# Patient Record
Sex: Female | Born: 1980 | Race: White | Hispanic: No | Marital: Single | Smoking: Never smoker
Health system: Southern US, Community
[De-identification: ages and names within clinical notes are randomized; demographics above are authoritative.]

---

## 2000-09-16 ENCOUNTER — Ambulatory Visit (HOSPITAL_COMMUNITY): Admission: RE | Admit: 2000-09-16 | Discharge: 2000-09-17 | Payer: Self-pay | Admitting: Oral and Maxillofacial Surgery

## 2000-10-01 HISTORY — PX: MOUTH SURGERY: SHX715

## 2010-03-10 ENCOUNTER — Emergency Department: Payer: Self-pay | Admitting: Emergency Medicine

## 2011-08-08 ENCOUNTER — Ambulatory Visit: Payer: Self-pay | Admitting: Internal Medicine

## 2011-08-17 ENCOUNTER — Ambulatory Visit: Payer: Self-pay | Admitting: Internal Medicine

## 2013-01-27 IMAGING — US ABDOMEN ULTRASOUND
1 series · 17 of 25 positions shown · non-contrast
Comparison: none

REASON FOR EXAM: fam hx ovarian ca
COMMENTS:

[Series 1: abdomen ultrasound · 17 of 81 slices shown]
[im 1/81]
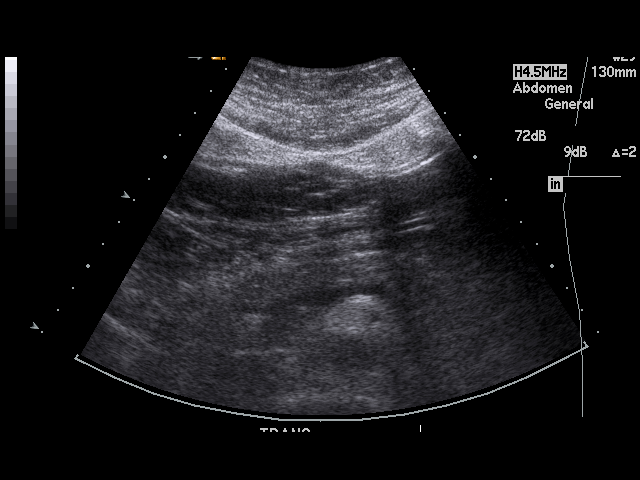
[im 7/81]
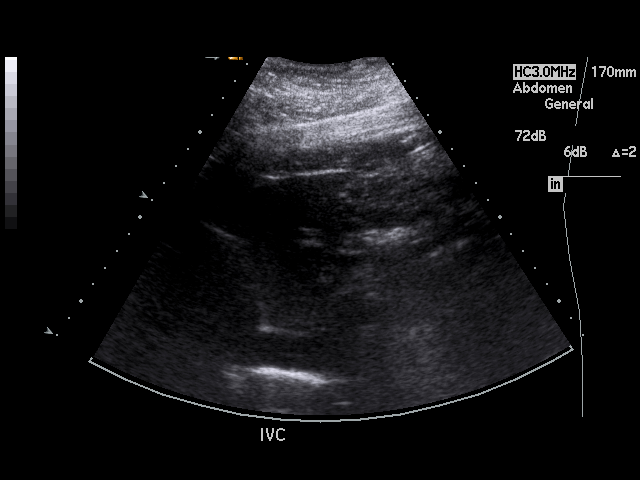
[im 11/81]
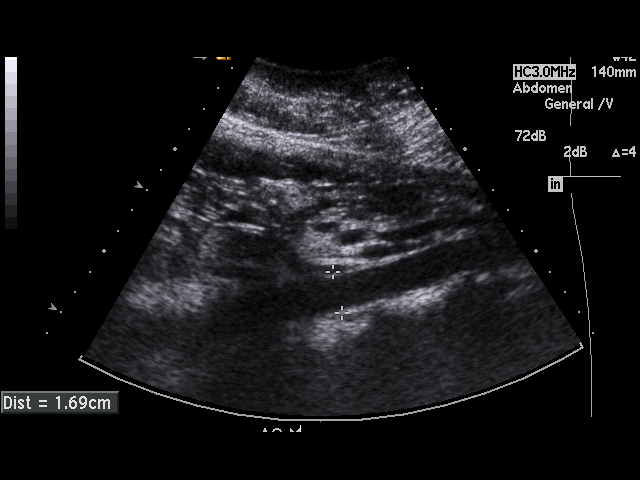
[im 17/81]
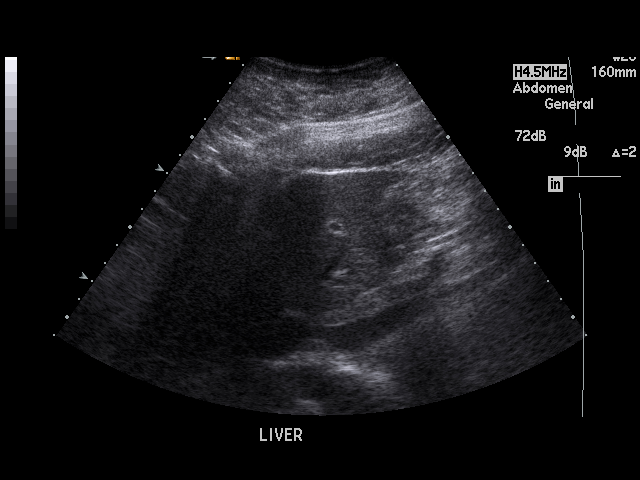
[im 21/81]
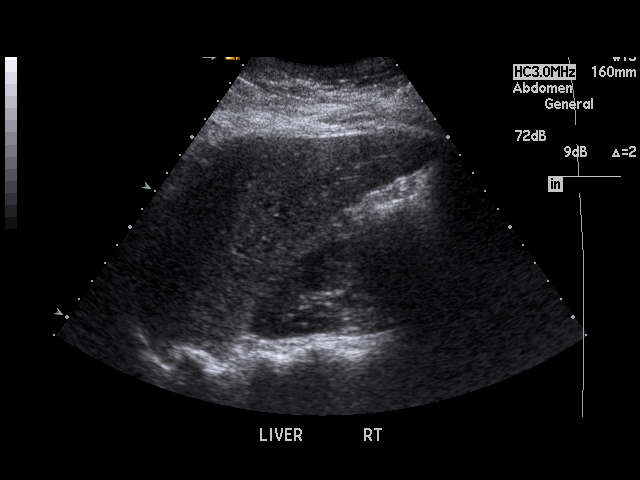
[im 27/81]
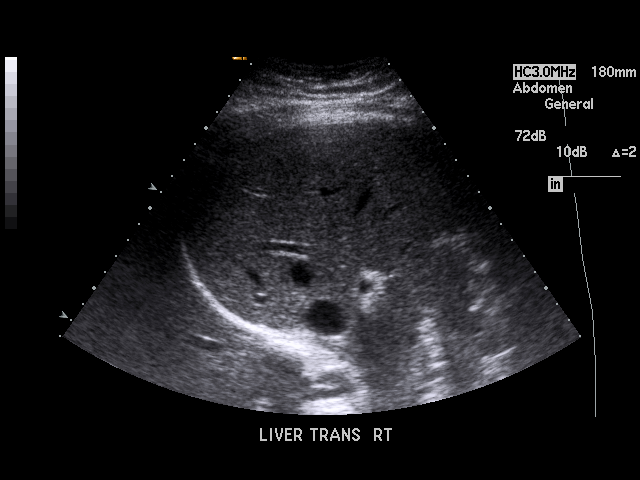
[im 31/81]
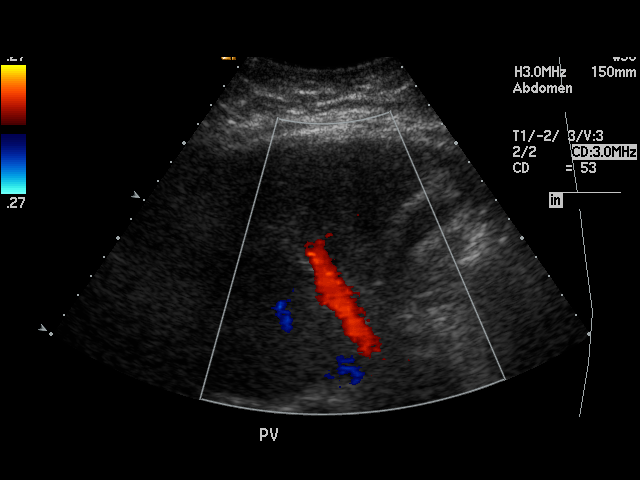
[im 37/81]
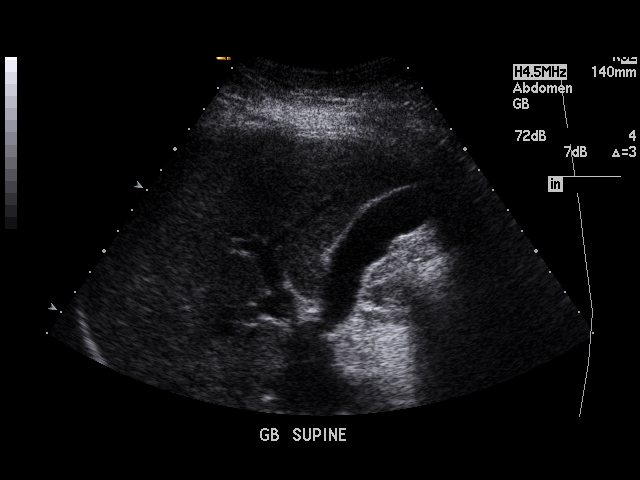
[im 41/81]
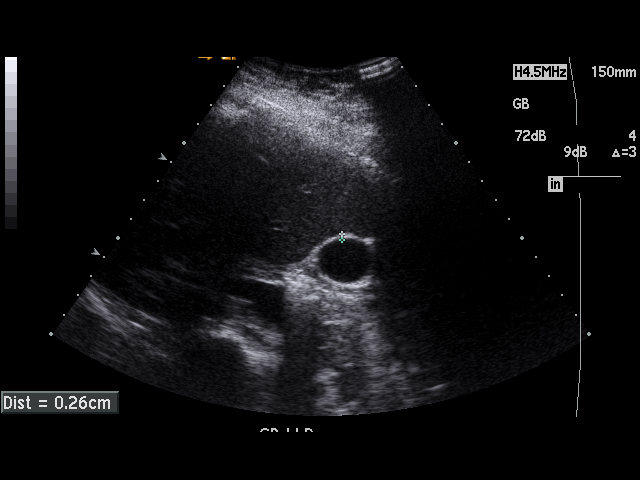
[im 44/81]
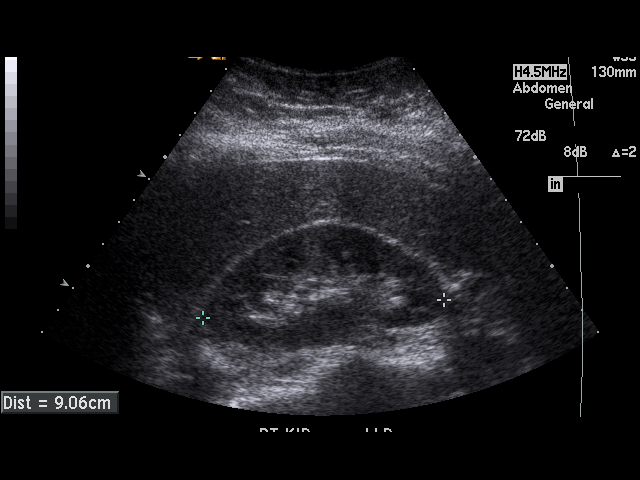
[im 51/81]
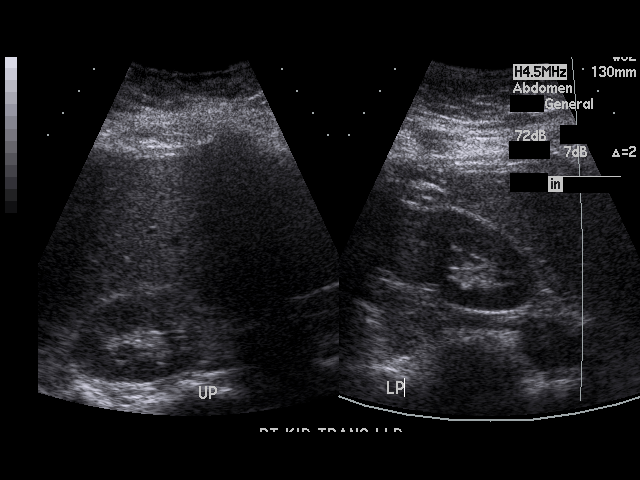
[im 54/81]
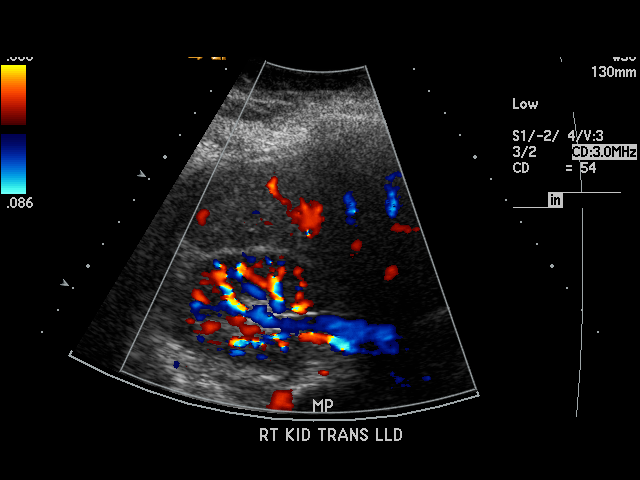
[im 61/81]
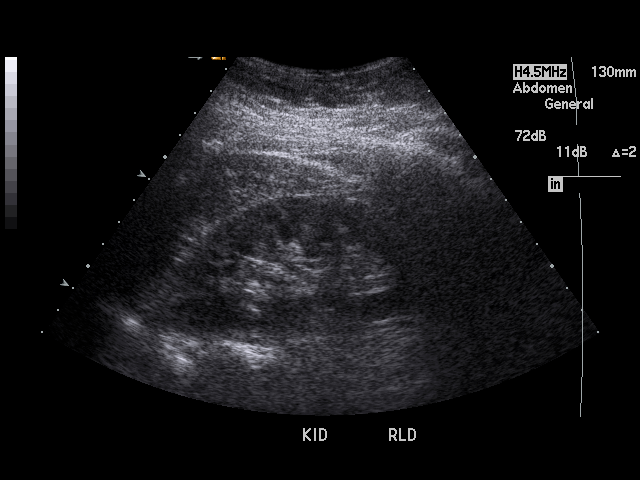
[im 64/81]
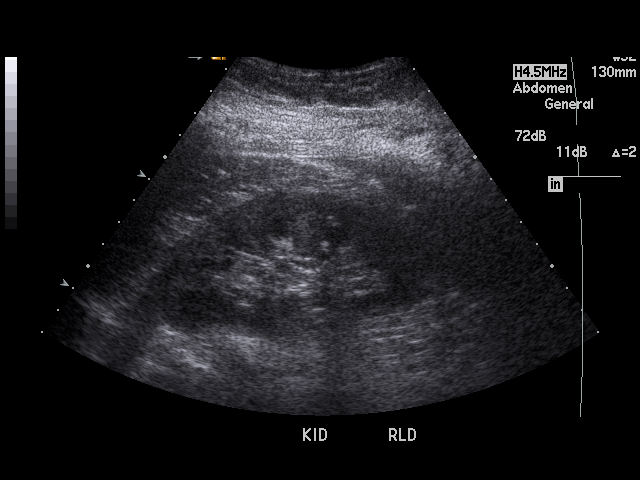
[im 71/81]
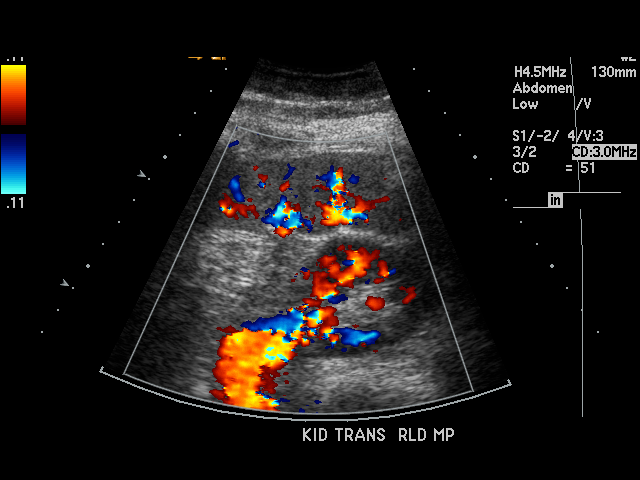
[im 74/81]
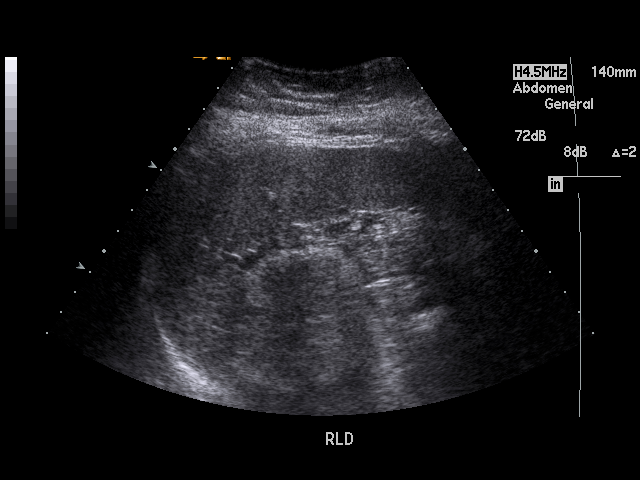
[im 81/81]
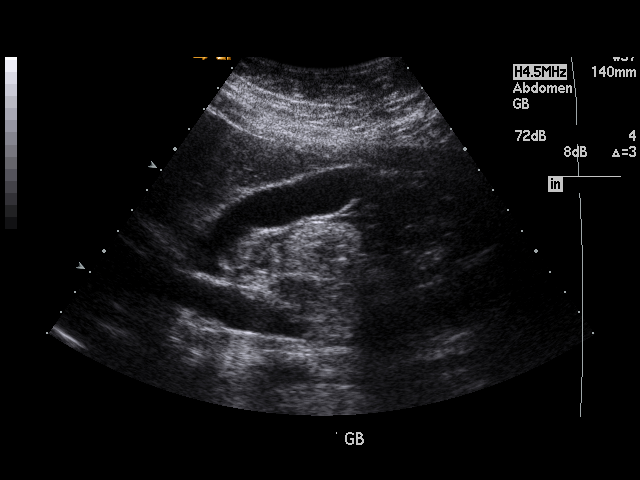

[17 of 25 positions shown; findings below may reference images not displayed]

PROCEDURE:     TEANNA - TEANNA ABDOMEN GENERAL SURVEY  - August 08, 2011  [DATE]

RESULT:     The liver exhibits normal echotexture with no focal mass nor
ductal dilation. Portal venous flow is normal in direction toward the liver.
The gallbladder is adequately distended with no evidence of stones, wall
thickening, or pericholecystic fluid. There is no positive sonographic
Murphy's sign. The common bile duct is normal at 2.7 mm in diameter. The
pancreatic tail was obscured by bowel gas but the pancreatic body are normal
in appearance. The spleen, abdominal aorta, inferior vena cava, and kidneys
exhibit no acute abnormality.
IMPRESSION: I see no acute abnormality of the visualized abdominal
viscera. The pancreatic tail could not be demonstrated due to bowel gas. No
ascites is demonstrated.

## 2013-01-27 IMAGING — US TRANSABDOMINAL ULTRASOUND OF PELVIS
1 series · 17 of 25 positions shown · non-contrast
Comparison: none

REASON FOR EXAM: fam hx ovarian ca irregular menses
COMMENTS:

[Series 1: transabdominal ultrasound of pelvis · 17 of 114 slices shown]
[im 1/114]
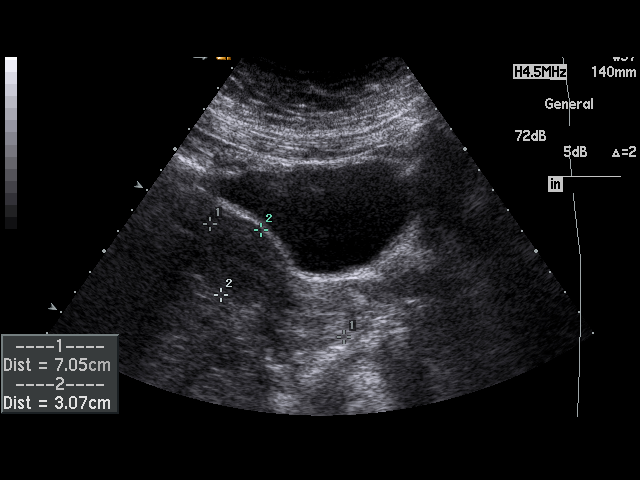
[im 10/114]
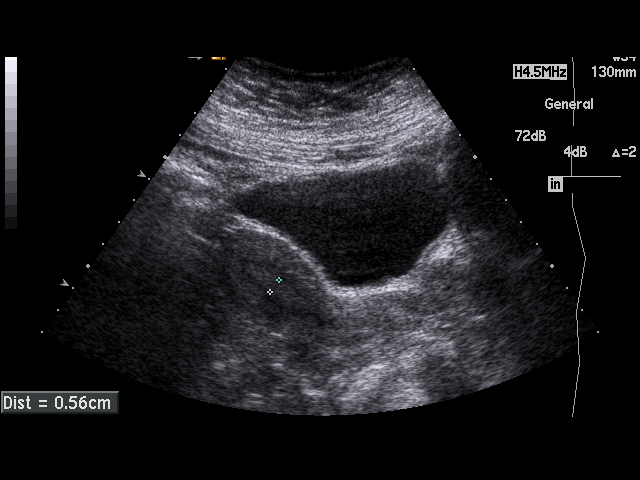
[im 15/114]
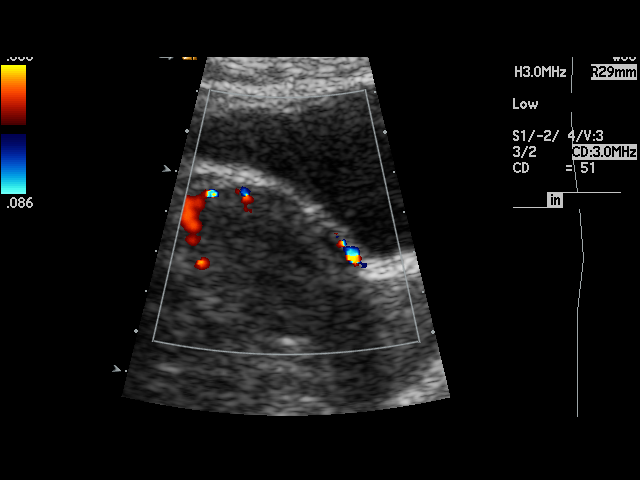
[im 24/114]
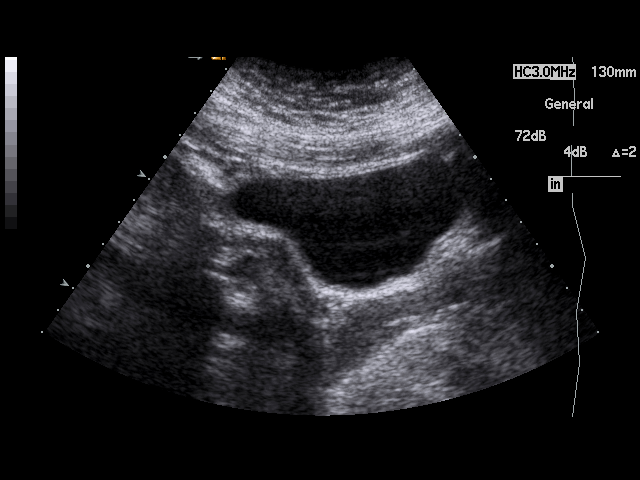
[im 29/114]
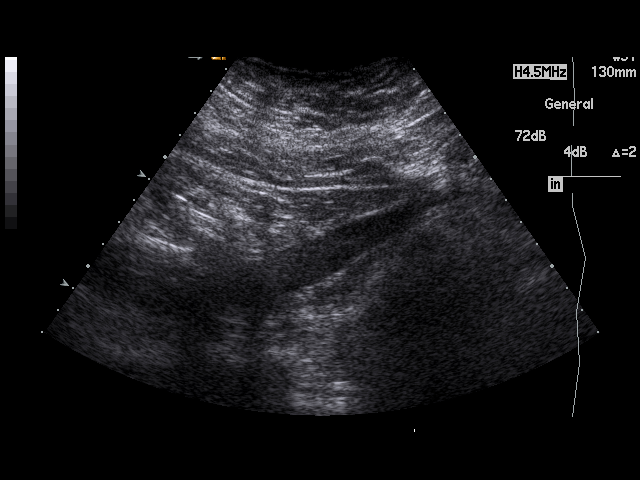
[im 38/114]
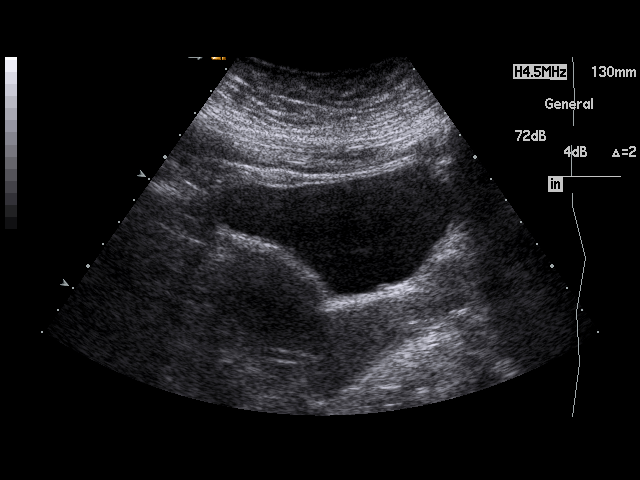
[im 43/114]
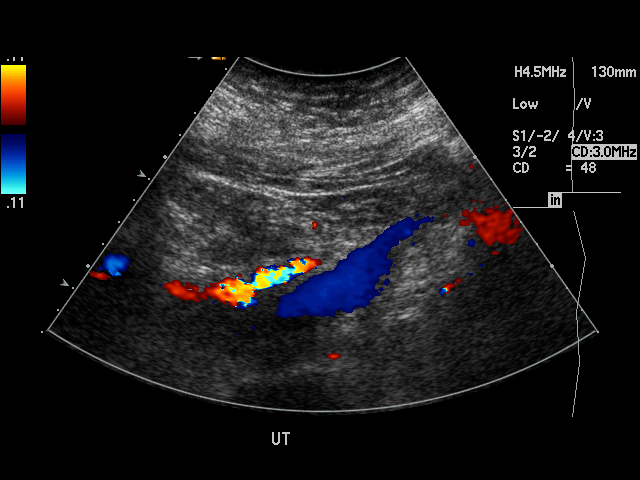
[im 52/114]
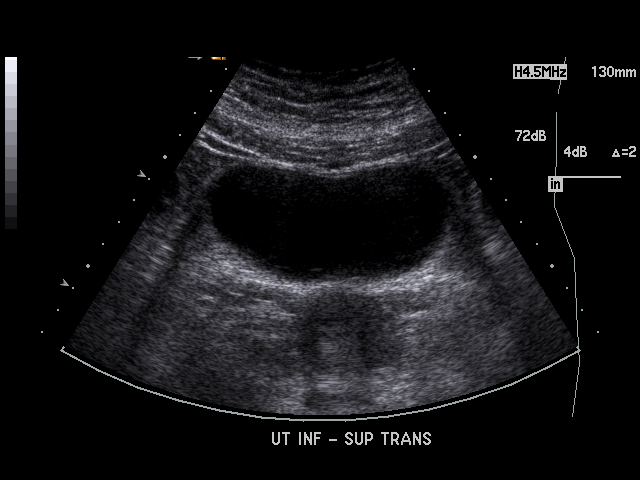
[im 57/114]
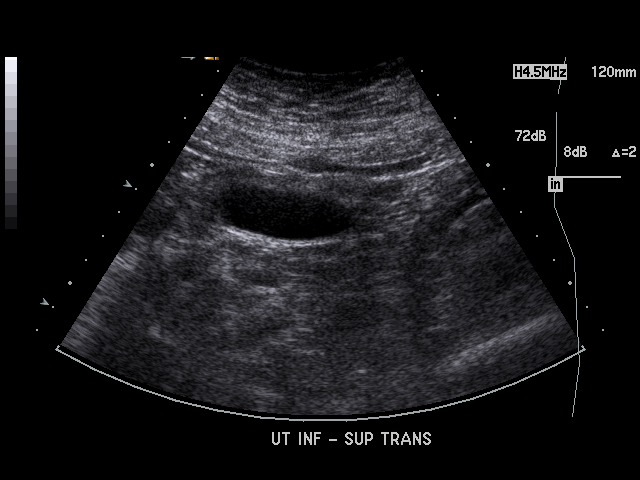
[im 62/114]
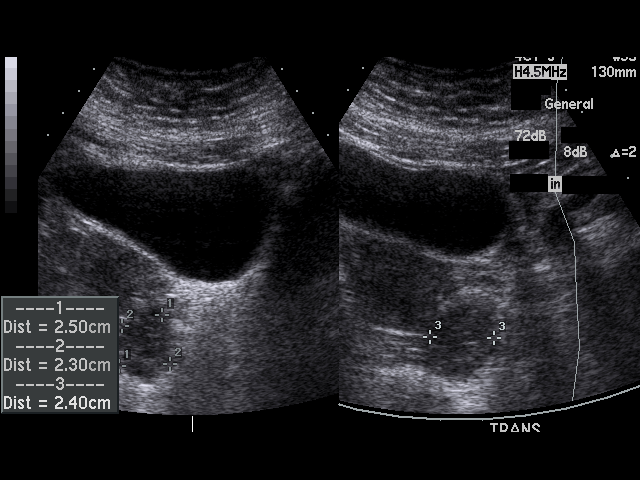
[im 71/114]
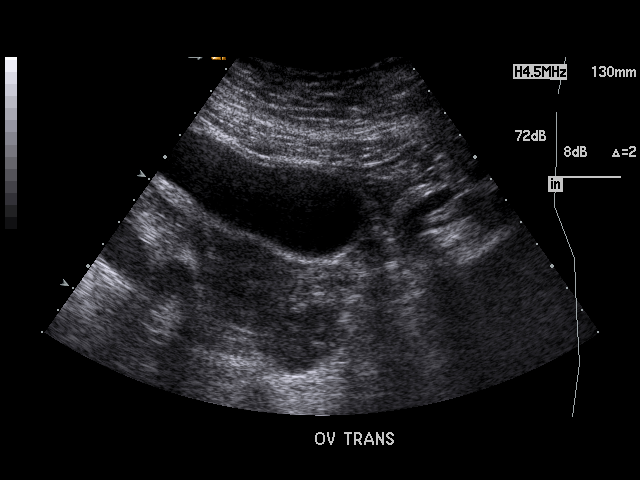
[im 76/114]
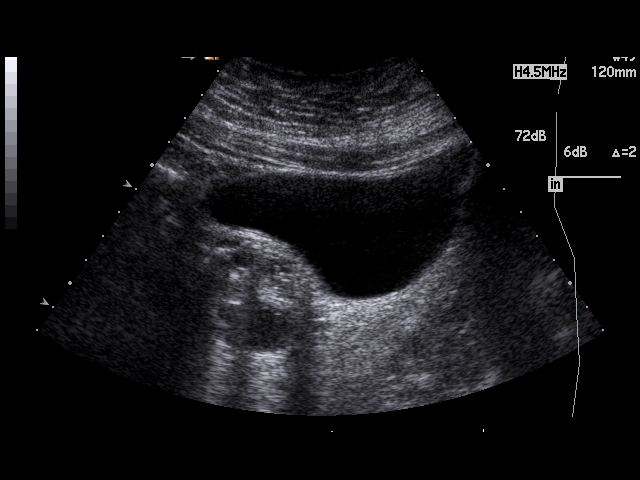
[im 85/114]
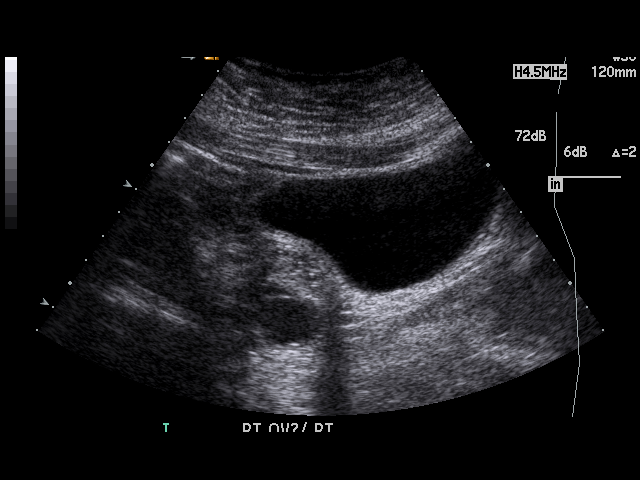
[im 90/114]
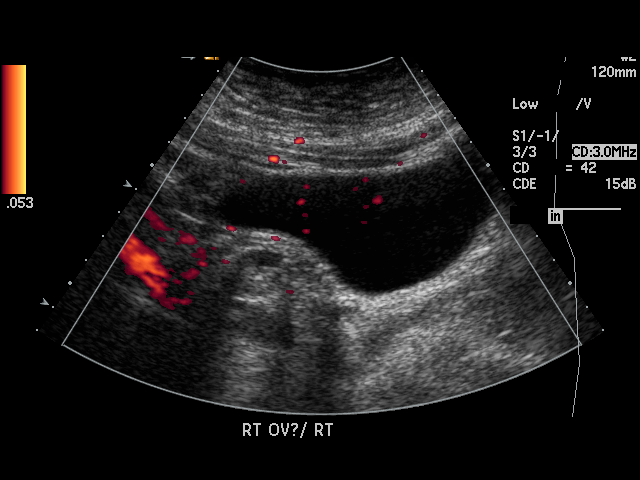
[im 99/114]
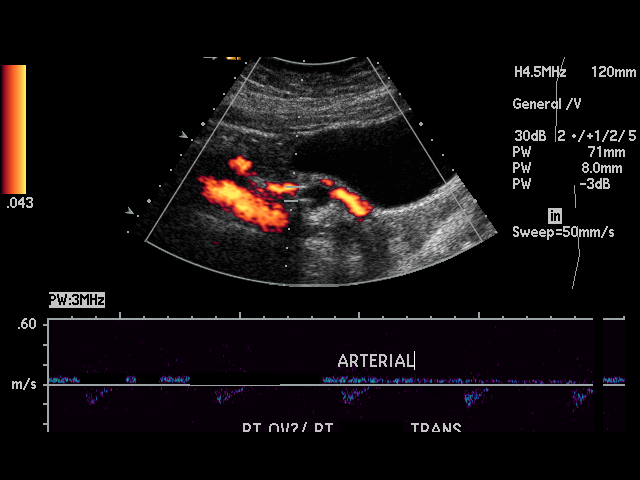
[im 104/114]
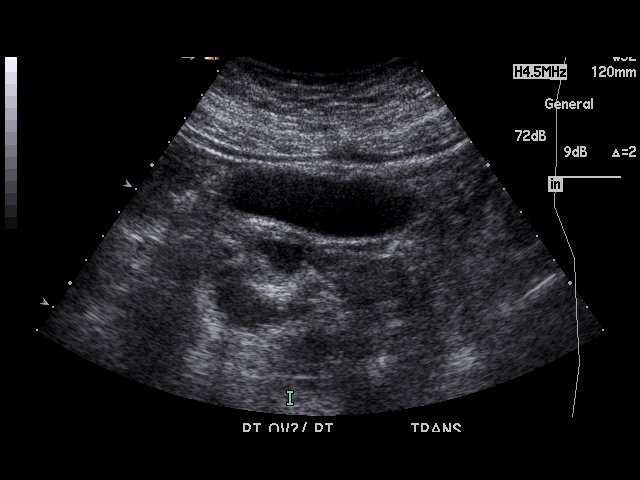
[im 114/114]
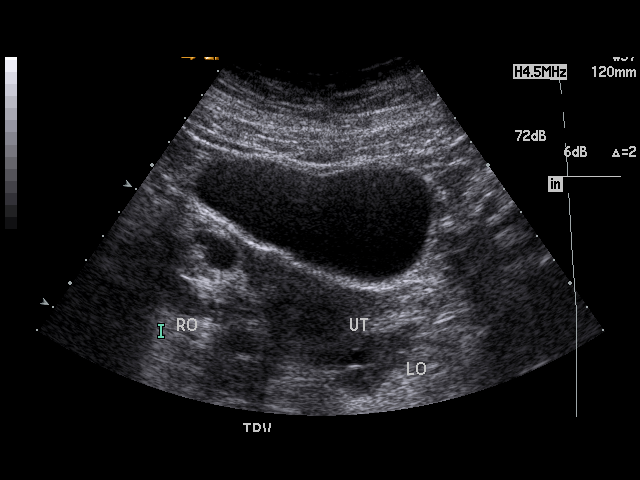

[17 of 25 positions shown; findings below may reference images not displayed]

PROCEDURE:     AXHAR - AXHAR PELVIS MASS EXAM  - [DATE]  [DATE] [DATE] [DATE]

RESULT:     The uterus is normal in contour and measures 7.1 x 3.1 x 3.8 cm.
The endometrial stripe measures just under 6 mm in diameter. The right ovary
measures 3.7 x 3.5 x 3.4 centimeters. However, its echotexture is
heterogeneous as compared to the normal appearing left ovary. The left ovary
measures 2.5 x 2.4 x 2.3 cm. The vascularity is of both ovaries is within
the limits of normal. No free fluid is demonstrated within the pelvis

The study is limited due to the patient's inability to undergo transvaginal
imaging.
IMPRESSION: 1. In the right adnexal region the appearance of the ovary is abnormal with
there being complex echoes present. This could reflect the presence of
multiple cysts containing debris, but solid entities are not excluded. The
left ovary appears normal.
2. The uterus exhibits no acute abnormality.

Gynecological evaluation pelvic CT scanning may be useful.

## 2013-07-17 IMAGING — CT CT PELVIS W/ CM
1 series · 15 of 32 positions shown, 19 images · non-contrast
Comparison: none

REASON FOR EXAM: us showed enlarged ovary
COMMENTS:

[Series 2: soft tissue · axial · 0.76mm/px · z∈[-55,+179]mm · 15 of 87 slices shown, 19 images]
[im 6/87  soft-tissue]
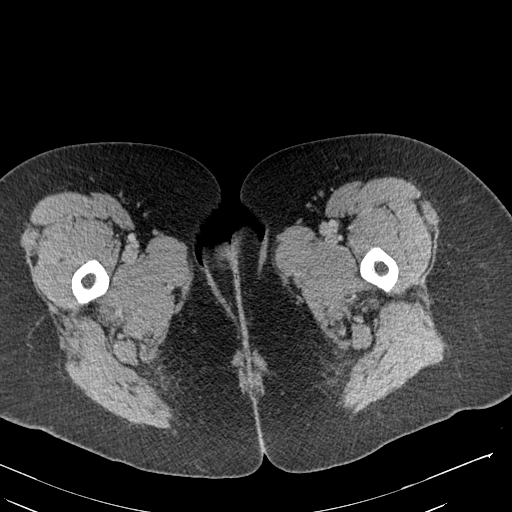
[im 6/87  bone]
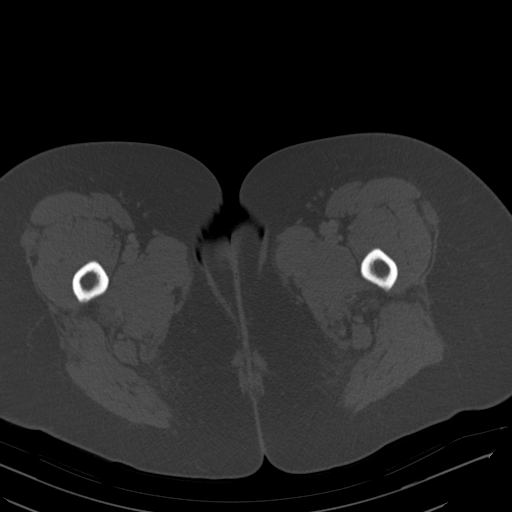
[im 12/87  soft-tissue]
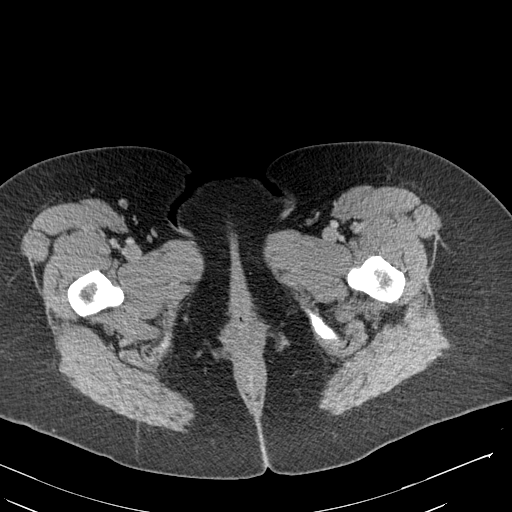
[im 17/87  soft-tissue]
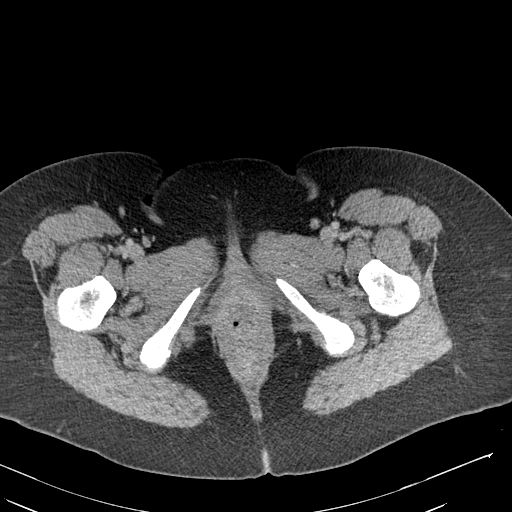
[im 25/87  soft-tissue]
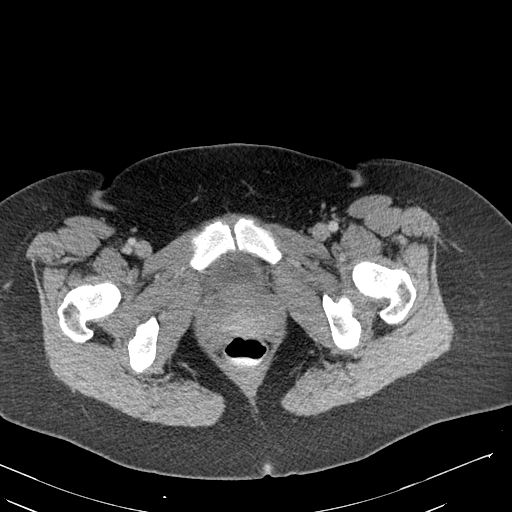
[im 31/87  soft-tissue]
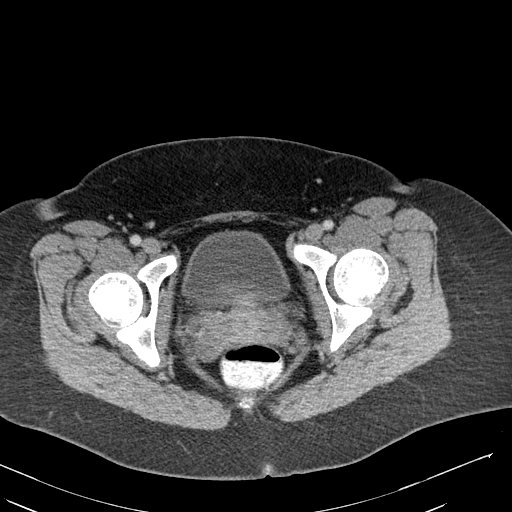
[im 37/87  soft-tissue]
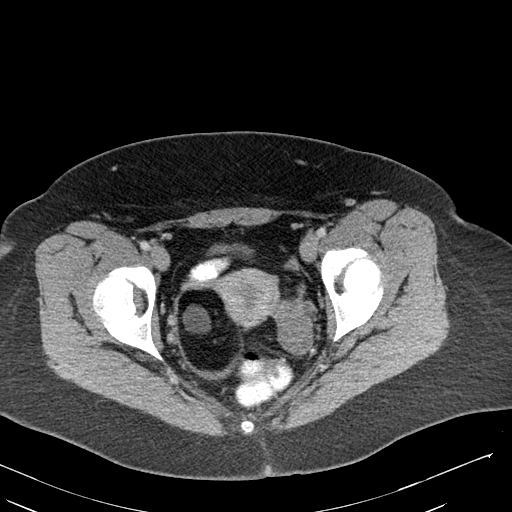
[im 45/87  soft-tissue]
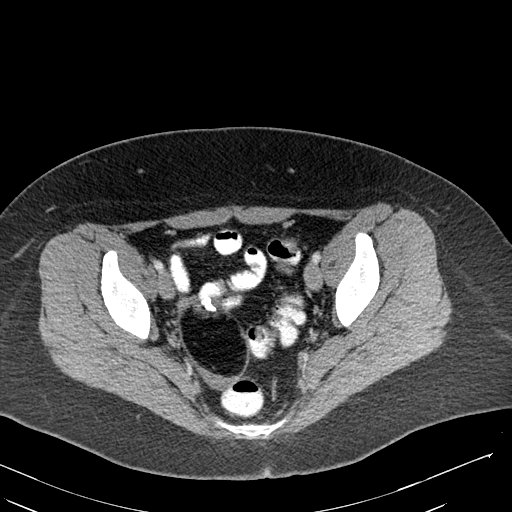
[im 50/87  soft-tissue]
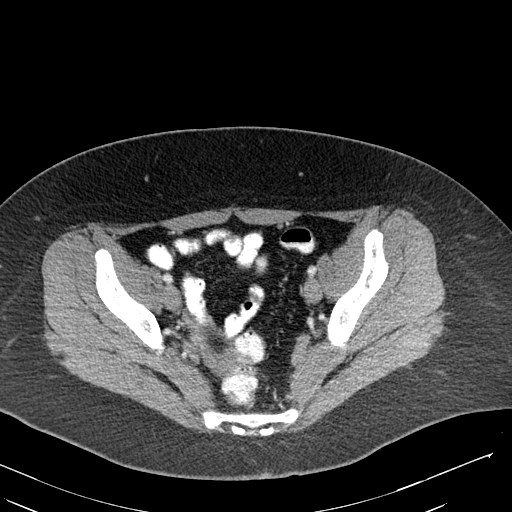
[im 56/87  soft-tissue]
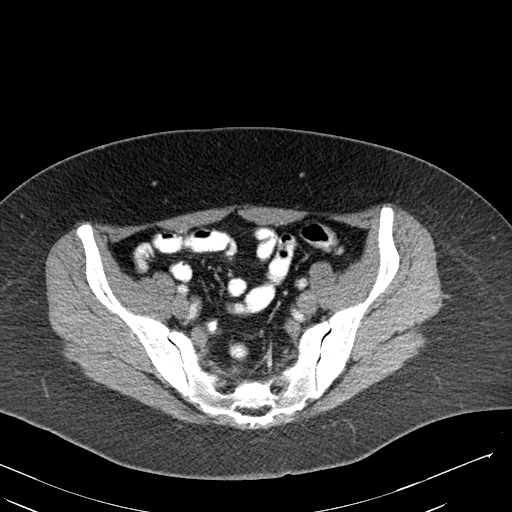
[im 56/87  bone]
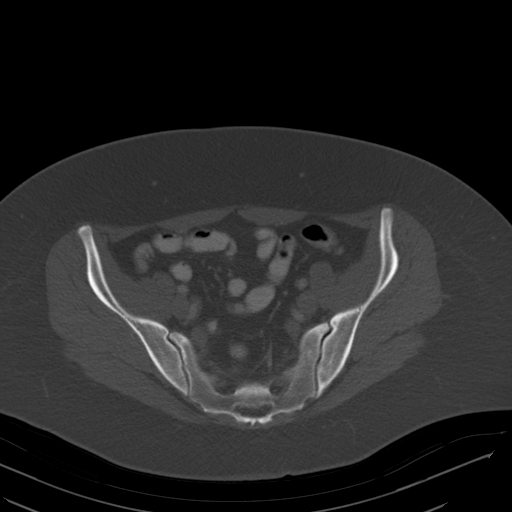
[im 62/87  soft-tissue]
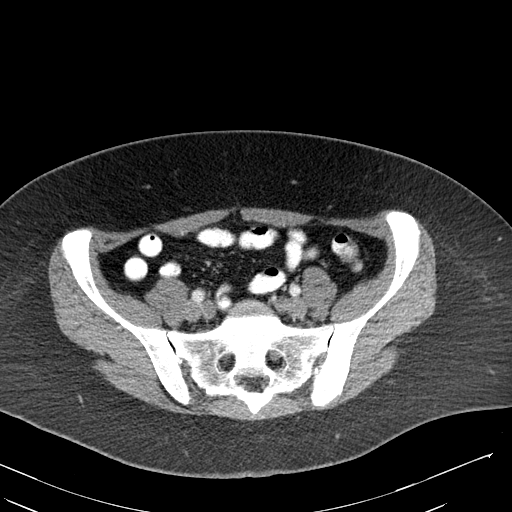
[im 70/87  soft-tissue]
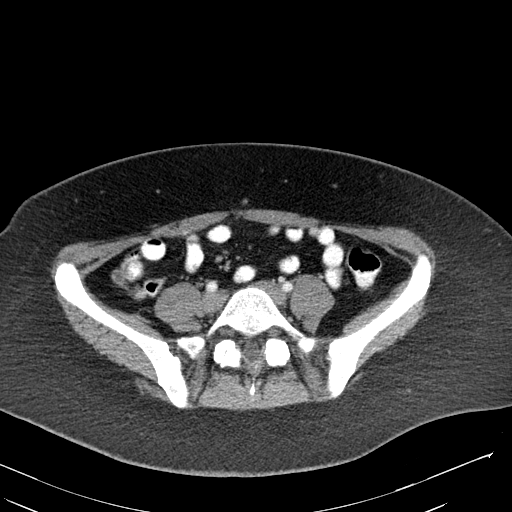
[im 75/87  soft-tissue]
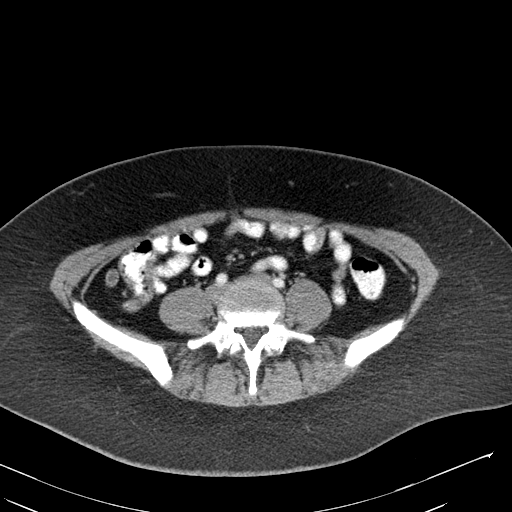
[im 75/87  lung]
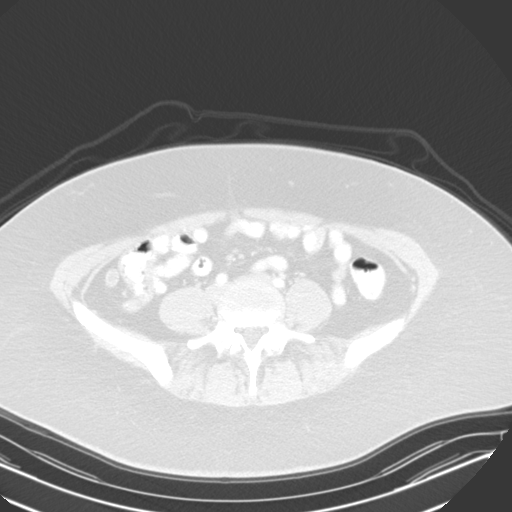
[im 78/87  lung]
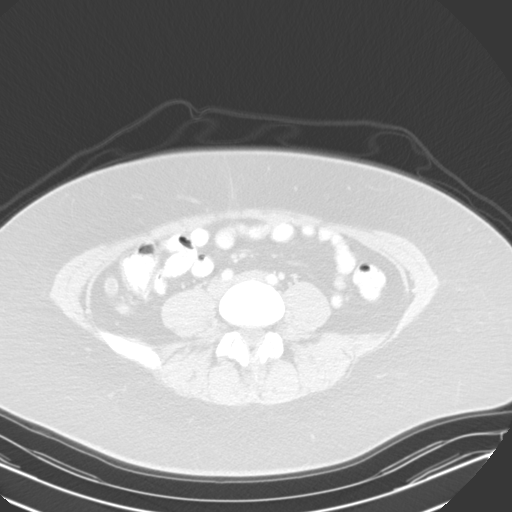
[im 81/87  soft-tissue]
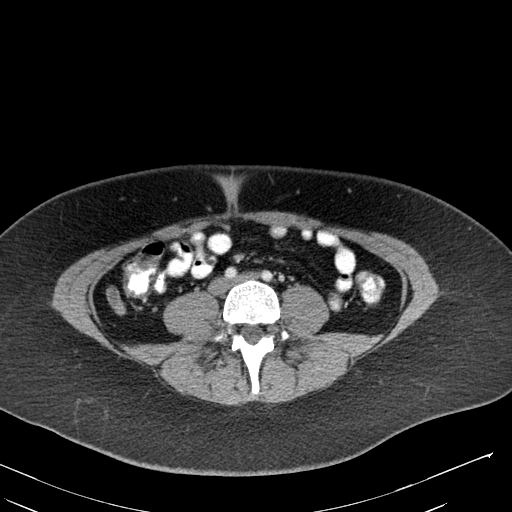
[im 81/87  lung]
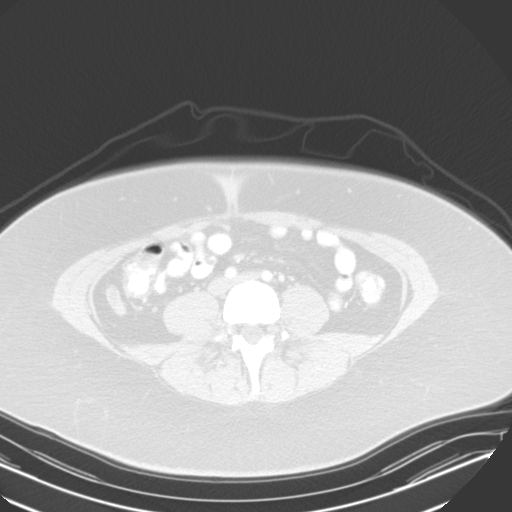
[im 84/87  lung]
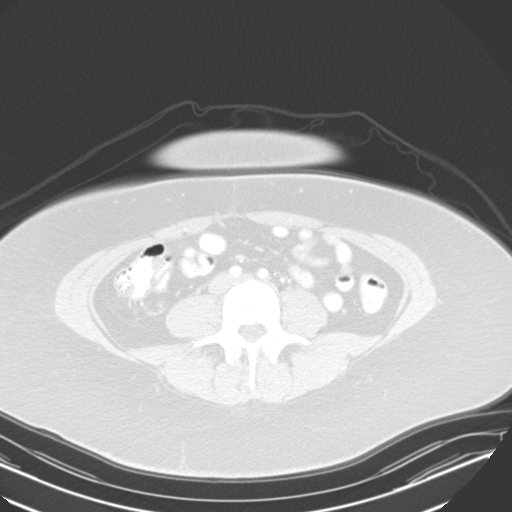

[15 of 32 positions shown; findings below may reference images not displayed]

PROCEDURE:     TIGER - TIGER PELVIS STANDARD W  - August 17, 2011  [DATE]

RESULT:     Axial CT scanning was performed through the pelvis with
reconstructions at 3 mm intervals and slice thicknesses following
intravenous administration of 85 cc of [id number]. The patient also received
oral contrast material.

In the left adnexal region there is a structure which measures approximately
3.9 cm AP x 2.9 cm transversely. I see no calcification within it. It is of
intermediate density. On the right there is a fat and soft tissue density
structure measuring 7.8 x 5.1 cm. This appears to be separate from bowel.
There may be calcification associated with it seen on image 43. The
rectosigmoid colon appears normal. I see no significant free fluid. The
uterus is normal in appearance. The partially distended urinary bladder also
appears normal. I see no inguinal lymphadenopathy. The bony pelvis exhibits
no acute abnormality.
IMPRESSION: 1. There is a complex appearing right adnexal mass. Its maximal dimensions
are as given above and the structure appears to have increased in size since
dimensions given on the previous pelvic ultrasound dated 08 August, 2011.
The left ovary may have increased slightly in size since the previous study.
2. I see no abnormality of the uterus nor of the rectosigmoid nor of the
urinary bladder.
3. There is no evidence of ascites nor of lymphadenopathy.

The findings on the right may reflect a process such as a dermoid or
possibly teratoma. On the left the appearance of the ovary is nonspecific.
Correlation with clinical and laboratory values is needed. Other entities
including infectious etiologies, neoplasia, as well as endometriosis could
produce some of these findings.

## 2022-11-19 ENCOUNTER — Encounter: Payer: Self-pay | Admitting: Intensive Care

## 2022-11-19 ENCOUNTER — Emergency Department
Admission: EM | Admit: 2022-11-19 | Discharge: 2022-11-19 | Disposition: A | Discharge status: Home / Self Care | Payer: BC Managed Care – PPO | Attending: Student in an Organized Health Care Education/Training Program | Admitting: Student in an Organized Health Care Education/Training Program

## 2022-11-19 ENCOUNTER — Ambulatory Visit
Admission: EM | Admit: 2022-11-19 | Discharge: 2022-11-19 | Payer: BC Managed Care – PPO | Attending: Family Medicine | Admitting: Family Medicine

## 2022-11-19 ENCOUNTER — Emergency Department: Payer: BC Managed Care – PPO

## 2022-11-19 ENCOUNTER — Other Ambulatory Visit: Payer: Self-pay

## 2022-11-19 DIAGNOSIS — S0101XA Laceration without foreign body of scalp, initial encounter: Secondary | ICD-10-CM

## 2022-11-19 DIAGNOSIS — Y92002 Bathroom of unspecified non-institutional (private) residence single-family (private) house as the place of occurrence of the external cause: Secondary | ICD-10-CM | POA: Insufficient documentation

## 2022-11-19 DIAGNOSIS — S0990XA Unspecified injury of head, initial encounter: Secondary | ICD-10-CM

## 2022-11-19 DIAGNOSIS — R55 Syncope and collapse: Secondary | ICD-10-CM | POA: Diagnosis not present

## 2022-11-19 DIAGNOSIS — W19XXXA Unspecified fall, initial encounter: Secondary | ICD-10-CM | POA: Diagnosis not present

## 2022-11-19 DIAGNOSIS — W01190A Fall on same level from slipping, tripping and stumbling with subsequent striking against furniture, initial encounter: Secondary | ICD-10-CM | POA: Insufficient documentation

## 2022-11-19 DIAGNOSIS — Z23 Encounter for immunization: Secondary | ICD-10-CM | POA: Insufficient documentation

## 2022-11-19 LAB — URINALYSIS, ROUTINE W REFLEX MICROSCOPIC
Bilirubin Urine: NEGATIVE
Glucose, UA: NEGATIVE mg/dL
Hgb urine dipstick: NEGATIVE
Ketones, ur: NEGATIVE mg/dL
Leukocytes,Ua: NEGATIVE
Nitrite: NEGATIVE
Protein, ur: NEGATIVE mg/dL
Specific Gravity, Urine: 1.026 (ref 1.005–1.030)
pH: 5 (ref 5.0–8.0)

## 2022-11-19 LAB — CBC
HCT: 44.9 % (ref 36.0–46.0)
Hemoglobin: 14.5 g/dL (ref 12.0–15.0)
MCH: 28.7 pg (ref 26.0–34.0)
MCHC: 32.3 g/dL (ref 30.0–36.0)
MCV: 88.9 fL (ref 80.0–100.0)
Platelets: 272 10*3/uL (ref 150–400)
RBC: 5.05 MIL/uL (ref 3.87–5.11)
RDW: 13.9 % (ref 11.5–15.5)
WBC: 10.7 10*3/uL — ABNORMAL HIGH (ref 4.0–10.5)
nRBC: 0 % (ref 0.0–0.2)

## 2022-11-19 LAB — BASIC METABOLIC PANEL
Anion gap: 8 (ref 5–15)
BUN: 15 mg/dL (ref 6–20)
CO2: 24 mmol/L (ref 22–32)
Calcium: 9 mg/dL (ref 8.9–10.3)
Chloride: 104 mmol/L (ref 98–111)
Creatinine, Ser: 1.01 mg/dL — ABNORMAL HIGH (ref 0.44–1.00)
GFR, Estimated: >60 mL/min (ref >60)
Glucose, Bld: 121 mg/dL — ABNORMAL HIGH (ref 70–99)
Potassium: 4.1 mmol/L (ref 3.5–5.1)
Sodium: 136 mmol/L (ref 135–145)

## 2022-11-19 LAB — PREGNANCY, URINE: Preg Test, Ur: NEGATIVE

## 2022-11-19 MED ORDER — SODIUM CHLORIDE 0.9 % IV BOLUS
1000.0000 mL | Freq: Once | INTRAVENOUS | Status: AC
  Administered 2022-11-19: 1000 mL via INTRAVENOUS

## 2022-11-19 MED ORDER — TETANUS-DIPHTH-ACELL PERTUSSIS 5-2.5-18.5 LF-MCG/0.5 IM SUSY
0.5000 mL | PREFILLED_SYRINGE | Freq: Once | INTRAMUSCULAR | Status: AC
  Administered 2022-11-19: 0.5 mL via INTRAMUSCULAR
  Filled 2022-11-19: qty 0.5

## 2022-11-19 NOTE — Discharge Instructions (Signed)
Given you passed out in the bathroom and suffered a head injury, you have been advised to follow up immediately in the emergency department for concerning signs or symptoms as discussed during your visit. Do not delay.   Based on concerns about condition, if you do not follow up in the ED, you may risk poor outcomes including worsening of condition, delayed treatment and potentially life threatening issues. If you have declined to go to the ED at this time, you should call your PCP immediately to set up a follow up appointment.

## 2022-11-19 NOTE — ED Triage Notes (Signed)
Patient reports syncopal episode today around 8am. Head laceration present to left side of head. Bleeding controlled.  Reports she had episode of emesis this AM around 1am. Has history of LOC after getting sick in past.

## 2022-11-19 NOTE — ED Provider Notes (Signed)
[facility] Provider Note    Event Date/Time   First MD Initiated Contact with Patient 11/19/22 1204     (approximate)   History   Loss of Consciousness and Emesis   HPI  Morgan Brown is a 42 y.o. female who presents today for evaluation after syncopal event.  Patient was sent from the urgent care for head imaging.  Patient reports that she had a syncopal event while she was getting ready for work this morning.  She reports that she fell and hit her head on a piece of bathroom furniture.  She reports that she felt very lightheaded just prior to syncopal event.  She reports that this has happened to her many times in the past and today felt no different.  She reports that she had food poisoning last night and had vomiting and diarrhea which has since resolved, however she reports that she feels dehydrated.  She came to the emergency department for her head laceration.  She has had no vomiting since the event.  No other injury sustained.  No visual changes, headache now, no neck pain.  She does not take anticoagulation.  Denies tongue biting or incontinence.  There are no problems to display for this patient.         Physical Exam   Triage Vital Signs: ED Triage Vitals [11/19/22 1151]  Enc Vitals Group     BP      Pulse      Resp      Temp      Temp src      SpO2      Weight 220 lb (99.8 kg)     Height 5' 4"$  (1.626 m)     Head Circumference      Peak Flow      Pain Score 3     Pain Loc      Pain Edu?      Excl. in [place]?     Most recent vital signs: Vitals:   11/19/22 1211 11/19/22 1405  BP: (!) 145/96 (!) 143/105  Pulse: (!) 110 81  Resp: 17 16  Temp: 98.1 F (36.7 C) 99 F (37.2 C)  SpO2: 100% 100%    Physical Exam Vitals and nursing note reviewed.  Constitutional:      General: Awake and alert. No acute distress.    Appearance: Normal appearance. The patient is normal weight.  HENT:     Head: Normocephalic.  Left  parietal area with 2 cm linear laceration that appears to be superficial with no retained foreign body.  No underlying hematoma noted.    Mouth: Mucous membranes are moist.  Eyes:     General: PERRL. Normal EOMs        Right eye: No discharge.        Left eye: No discharge.     Conjunctiva/sclera: Conjunctivae normal.  Cardiovascular:     Rate and Rhythm: Normal rate and regular rhythm.     Pulses: Normal pulses.     Heart sounds: Normal heart sounds Pulmonary:     Effort: Pulmonary effort is normal. No respiratory distress.     Breath sounds: Normal breath sounds.  Abdominal:     Abdomen is soft. There is no abdominal tenderness. No rebound or guarding. No distention. Musculoskeletal:        General: No swelling. Normal range of motion.     Cervical back: Normal range of motion and neck supple.  No midline cervical  spine tenderness.  Full range of motion of neck.  Negative Spurling test.  Negative Lhermitte sign.  Normal strength and sensation in bilateral upper extremities. Normal grip strength bilaterally.  Normal intrinsic muscle function of the hand bilaterally.  Normal radial pulses bilaterally. Skin:    General: Skin is warm and dry.     Capillary Refill: Capillary refill takes less than 2 seconds.     Findings: No rash.  Neurological:     Mental Status: The patient is awake and alert.  Neurological: GCS 15 alert and oriented x3 Normal speech, no expressive or receptive aphasia or dysarthria Cranial nerves II through XII intact Normal visual fields 5 out of 5 strength in all 4 extremities with intact sensation throughout No extremity drift Normal finger-to-nose testing, no limb or truncal ataxia      ED Results / Procedures / Treatments   Labs (all labs ordered are listed, but only abnormal results are displayed) Labs Reviewed  BASIC METABOLIC PANEL - Abnormal; Notable for the following components:      Result Value   Glucose, Bld 121 (*)    Creatinine, Ser 1.01  (*)    All other components within normal limits  CBC - Abnormal; Notable for the following components:   WBC 10.7 (*)    All other components within normal limits  URINALYSIS, ROUTINE W REFLEX MICROSCOPIC - Abnormal; Notable for the following components:   Color, Urine YELLOW (*)    APPearance HAZY (*)    All other components within normal limits  PREGNANCY, URINE  POC URINE PREG, ED     EKG     RADIOLOGY I independently reviewed and interpreted imaging and agree with radiologists findings.     PROCEDURES:  Critical Care performed:   Marland KitchenMarland KitchenLaceration Repair  Date/Time: 11/19/2022 2:11 PM  Performed by: Marquette Old, PA-C Authorized by: Marquette Old, PA-C   Consent:    Consent obtained:  Verbal   Consent given by:  Patient   Risks, benefits, and alternatives were discussed: yes     Risks discussed:  Infection, need for additional repair, nerve damage, poor wound healing, poor cosmetic result, pain, retained foreign body, tendon damage and vascular damage   Alternatives discussed:  No treatment Universal protocol:    Procedure explained and questions answered to patient or proxy's satisfaction: yes     Relevant documents present and verified: yes     Test results available: yes     Imaging studies available: yes     Required blood products, implants, devices, and special equipment available: yes     Site/side marked: yes     Immediately prior to procedure, a time out was called: yes     Patient identity confirmed:  Verbally with patient Anesthesia:    Anesthesia method:  None Laceration details:    Location:  Scalp   Scalp location:  L parietal   Length (cm):  2   Depth (mm):  1 Pre-procedure details:    Preparation:  Patient was prepped and draped in usual sterile fashion and imaging obtained to evaluate for foreign bodies Exploration:    Limited defect created (wound extended): no     Hemostasis achieved with:  Direct pressure   Imaging outcome: foreign  body not noted     Wound exploration: wound explored through full range of motion and entire depth of wound visualized     Wound extent: areolar tissue not violated, fascia not violated, no foreign body, no signs of injury,  no nerve damage, no tendon damage, no underlying fracture and no vascular damage     Contaminated: no   Treatment:    Area cleansed with:  Saline   Amount of cleaning:  Standard   Irrigation solution:  Sterile saline   Irrigation method:  Pressure wash and syringe   Debridement:  None   Undermining:  None   Scar revision: no   Skin repair:    Repair method:  Staples   Number of staples:  1 Repair type:    Repair type:  Simple Post-procedure details:    Dressing:  Open (no dressing)   Procedure completion:  Tolerated well, no immediate complications    MEDICATIONS ORDERED IN ED: Medications  sodium chloride 0.9 % bolus 1,000 mL (0 mLs Intravenous Stopped 11/19/22 1405)  Tdap (BOOSTRIX) injection 0.5 mL (0.5 mLs Intramuscular Given 11/19/22 1357)     IMPRESSION / MDM / [place] / ED COURSE  I reviewed the triage vital signs and the nursing notes.   Differential diagnosis includes, but is not limited to, arrhythmia, vasovagal syncope, dehydration, electrolyte disarray, symptomatic anemia.  Patient is awake and alert, mildly tachycardic on arrival, though normotensive and afebrile.  There is no tongue biting or incontinence to suggest seizure.  EKG obtained in triage is nonischemic, no delta wave or epsilon wave.  Reviewed by attending physician.  Labs obtained are overall reassuring.  She does appear to be slightly volume depleted, she was hydrated with 1 L of normal saline with improvement of her symptoms.  CT head and neck obtained because this is why she was sent from the urgent care.  However, she has no focal neurological deficits, no vomiting, no visual changes, and does not take anticoagulation.  Her CT scans were negative for any acute  findings.  Her Tdap was updated.  Her laceration was irrigated and closed with staples.  We discussed staple care and timeline for removal.  We also discussed return precautions.  Patient understands and agrees with plan.  She was discharged in stable condition.    Patient's presentation is most consistent with acute complicated illness / injury requiring diagnostic workup.     FINAL CLINICAL IMPRESSION(S) / ED DIAGNOSES   Final diagnoses:  Syncope and collapse  Injury of head, initial encounter  Scalp laceration, initial encounter     Rx / DC Orders   ED Discharge Orders     None        Note:  This document was prepared using Dragon voice recognition software and may include unintentional dictation errors.   Emeline Gins 11/19/22 1414    Merlyn Lot, MD 11/19/22 [phone]

## 2022-11-19 NOTE — ED Notes (Signed)
Patient is being discharged from the Urgent Care and sent to the Emergency Department via POV . Per Lyndee Hensen, DO patient is in need of higher level of care due to syncopal episode and head injury, head laceration.. Patient is aware and verbalizes understanding of plan of care.  Vitals:   11/19/22 1013 11/19/22 1014  BP: (S) (!) 163/113 (!) 138/97  Pulse: 100   Temp: 98.4 F (36.9 C)   SpO2: 100%

## 2022-11-19 NOTE — Discharge Instructions (Addendum)
Your blood work is normal.  You appear to be slightly dehydrated you were given IV fluids.  Your laceration was cleaned and closed with staples.Marland Kitchen Keep the area clean and dry.  Wash multiple times per day with soap and water.  Do not go into the ocean or swimming pool.  Return to the emergency department or your primary care physician's office in 7 days for staple removal.  Return to the emergency department for:  -- Fever > 100.33F -- Increase pain in the wound -- Increase redness and swelling -- Pus coming from the wound -- Wound bleeds more than a small amount or it does not stop -- Wound edges come apart -- Severe pain -- Weakness or numbness in the affected area  Or any other new or worsening symptoms. It was a pleasure caring for you.

## 2022-11-19 NOTE — ED Triage Notes (Addendum)
Pt states she was getting ready for work this morning and had a syncopal episode around 8am this morning & fell back & hit back of head, pt states this was unwitnessed. Pt does have a laceration states she thinks she hit something with metal when she fell. Pt states she was out for a "few seconds." Pt reports last night she did have an upset stomach from something she ate last night.

## 2022-11-19 NOTE — ED Provider Notes (Signed)
[facility]    CSN: [id number] Arrival date & time: 11/19/22  0956      History   Chief Complaint Chief Complaint  Patient presents with   Head Injury   Loss of Consciousness   Head Laceration    HPI Morgan Brown is a 42 y.o. female.   HPI   Morgan Brown presents for head injury after passing out at home in the bathroom this morning while trying to get ready for work. She fell and hit her head on  a piece of metal. States she doesn't quite remember what happened in the bathroom but woke up on the floor after "maybe 3 seconds."  She has a head wound with bleeding. She is not on any blood thinners. States she must have eaten something that didn't agree with her. Had vomiting and diarrhea. Reports similar sx in the past and was told she had "something that just happens."         History reviewed. No pertinent past medical history.  There are no problems to display for this patient.   Past Surgical History:  Procedure Laterality Date   MOUTH SURGERY  2002    OB History   No obstetric history on file.      Home Medications    Prior to Admission medications   Not on File    Family History History reviewed. No pertinent family history.  Social History Social History   Tobacco Use   Smoking status: Never   Smokeless tobacco: Never  Vaping Use   Vaping Use: Never used  Substance Use Topics   Alcohol use: Never   Drug use: Never     Allergies   Patient has no known allergies.   Review of Systems Review of Systems: negative unless otherwise stated in HPI.      Physical Exam Triage Vital Signs ED Triage Vitals  Enc Vitals Group     BP 11/19/22 1013 (S) (!) 163/113     Pulse Rate 11/19/22 1013 100     Resp --      Temp 11/19/22 1013 98.4 F (36.9 C)     Temp Source 11/19/22 1013 Oral     SpO2 11/19/22 1013 100 %     Weight 11/19/22 1011 220 lb (99.8 kg)     Height 11/19/22 1011 '5\' 4"'$  (1.626 m)     Head Circumference --       Peak Flow --      Pain Score 11/19/22 1011 0     Pain Loc --      Pain Edu? --      Excl. in [place]? --    No data found.  Updated Vital Signs BP (!) 138/97   Pulse 100   Temp 98.4 F (36.9 C) (Oral)   Ht '5\' 4"'$  (1.626 m)   Wt 99.8 kg   LMP 10/19/2022 (Approximate)   SpO2 100%   BMI 37.76 kg/m   Visual Acuity Right Eye Distance:   Left Eye Distance:   Bilateral Distance:    Right Eye Near:   Left Eye Near:    Bilateral Near:     Physical Exam GEN:     alert, female and no distress    HENT:  mucus membranes moist, oropharyngeal without lesions or erythema,  nares patent, left parietal area with 2-3 cm laceration with bleeding,  EYES:   pupils equal and reactive, EOM intact NECK:  supple, normal ROM, no midline C-spine tenderness  RESP:  clear to auscultation bilaterally, no increased work of breathing  CVS:   regular rate and rhythm EXT:   normal ROM, atraumatic, no edema, no lumbar or thoracic midline tenderness NEURO:  CN 2-12 grossly intact, speech normal, alert and oriented, strength 5/5 BUE and BLE  Skin:   warm and dry, scalp laceration as above       UC Treatments / Results  Labs (all labs ordered are listed, but only abnormal results are displayed) Labs Reviewed - No data to display  EKG   Radiology No results found.  Procedures Procedures (including critical care time)  Medications Ordered in UC Medications - No data to display  Initial Impression / Assessment and Plan / UC Course  I have reviewed the triage vital signs and the nursing notes.  Pertinent labs & imaging results that were available during my care of the patient were reviewed by me and considered in my medical decision making (see chart for details).       Patient is a 42 y.o. female  who presents for syncopal event with subsequent head injury that occurred this morning.  Overall patient is non-ill-appearing and afebrile.  Vital signs stable. Neuro exam is  unremarkable.  Given her head laceration and syncopal episode, recommended ED evaluation for head imaging. Pt stable to travel via private vehicle to [facility] Emergency Department. Called and spoke with triage RN Luetta Nutting) to alert her of patients arrival.   Discussed MDM, treatment plan and plan for follow-up with patient who agrees with plan.    Final Clinical Impressions(s) / UC Diagnoses   Final diagnoses:  Fall, initial encounter  Injury of head, initial encounter  Laceration of scalp, initial encounter     Discharge Instructions      Given you passed out in the bathroom and suffered a head injury, you have been advised to follow up immediately in the emergency department for concerning signs or symptoms as discussed during your visit. Do not delay.   Based on concerns about condition, if you do not follow up in the ED, you may risk poor outcomes including worsening of condition, delayed treatment and potentially life threatening issues. If you have declined to go to the ED at this time, you should call your PCP immediately to set up a follow up appointment.      ED Prescriptions   None    PDMP not reviewed this encounter.   Lyndee Hensen, DO 11/23/22 2108

## 2022-11-28 ENCOUNTER — Ambulatory Visit
Admission: RE | Admit: 2022-11-28 | Discharge: 2022-11-28 | Disposition: A | Discharge status: Home / Self Care | Payer: BC Managed Care – PPO | Source: Ambulatory Visit | Attending: Family Medicine | Admitting: Family Medicine

## 2022-11-28 VITALS — BP 156/110 | HR 72 | Temp 98.4°F | Ht 64.0 in | Wt 220.0 lb

## 2022-11-28 DIAGNOSIS — Z4802 Encounter for removal of sutures: Secondary | ICD-10-CM

## 2022-11-28 DIAGNOSIS — S0990XD Unspecified injury of head, subsequent encounter: Secondary | ICD-10-CM

## 2022-11-28 NOTE — ED Triage Notes (Signed)
Pt c/o staple removal  Pt states that she has 1 staple above the left ear. She denies pain but states that she has "phantom pains" that come and go.

## 2022-11-28 NOTE — ED Triage Notes (Signed)
Pt has 1 staple on the left side of the head.   Pt voiced no pains or concerns at time of removal.   Pt had no further questions or concerns.

## 2022-11-28 NOTE — Discharge Instructions (Addendum)
I am glad you are feeling better.  You can resume your home normal handwashing routine.

## 2022-11-28 NOTE — ED Provider Notes (Signed)
[facility]    CSN: [id number] Arrival date & time: 11/28/22  1436      History   Chief Complaint Chief Complaint  Patient presents with   Suture / Staple Removal    HPI Waverly Clifford Voegele is a 42 y.o. female.   HPI  Gissella presents for staple removal.   Patient had a syncopal episode on 219 where she hit her head on a piece of metal.  She went to the emergency department and the head laceration was closed with staples.  She has no other concerns or issues today.    History reviewed. No pertinent past medical history.  There are no problems to display for this patient.   Past Surgical History:  Procedure Laterality Date   MOUTH SURGERY  2002    OB History   No obstetric history on file.      Home Medications    Prior to Admission medications   Not on File    Family History History reviewed. No pertinent family history.  Social History Social History   Tobacco Use   Smoking status: Never   Smokeless tobacco: Never  Vaping Use   Vaping Use: Never used  Substance Use Topics   Alcohol use: Never   Drug use: Never     Allergies   Patient has no known allergies.   Review of Systems Review of Systems :negative unless otherwise stated in HPI.      Physical Exam Triage Vital Signs ED Triage Vitals [11/28/22 1456]  Enc Vitals Group     BP      Pulse      Resp      Temp      Temp src      SpO2      Weight 220 lb (99.8 kg)     Height '5\' 4"'$  (1.626 m)     Head Circumference      Peak Flow      Pain Score 0     Pain Loc      Pain Edu?      Excl. in [place]?    No data found.  Updated Vital Signs BP (!) 156/110 (BP Location: Left Arm)   Pulse 72   Temp 98.4 F (36.9 C) (Oral)   Ht '5\' 4"'$  (1.626 m)   Wt 99.8 kg   LMP 11/28/2022 (Approximate)   SpO2 99%   BMI 37.76 kg/m   Visual Acuity Right Eye Distance:   Left Eye Distance:   Bilateral Distance:    Right Eye Near:   Left Eye Near:    Bilateral Near:      Physical Exam  GEN: well appearing female in no acute distress  CVS: well perfused  RESP: speaking in full sentences without pause, no respiratory distress  SKIN: Well-approximated left radius laceration with 1 staple present   UC Treatments / Results  Labs (all labs ordered are listed, but only abnormal results are displayed) Labs Reviewed - No data to display  EKG   Radiology No results found.  Procedures Procedures (including critical care time)  Medications Ordered in UC Medications - No data to display  Initial Impression / Assessment and Plan / UC Course  I have reviewed the triage vital signs and the nursing notes.  Pertinent labs & imaging results that were available during my care of the patient were reviewed by me and considered in my medical decision making (see chart for details).     Patient is  a 42 y.o. femalewho presents for staple removal of her head laceration.  Patient had a 2 cm left parietal scalp laceration that was repaired with 1 staple.  Overall, patient is well-appearing and well-hydrated.  Vital signs stable.  Alahia is afebrile.  Head wound is well-healed. Nursing staff to remove staple. No sign of infection to suggest antibiotics at this time.     Reviewed expectations regarding course of current medical issues.  All questions asked were answered.  Outlined signs and symptoms indicating need for more acute intervention. Patient verbalized understanding. After Visit Summary given.   Final Clinical Impressions(s) / UC Diagnoses   Final diagnoses:  Removal of staple  Injury of head, subsequent encounter     Discharge Instructions      I am glad you are feeling better.  You can resume your home normal handwashing routine.      ED Prescriptions   None    PDMP not reviewed this encounter.              Lyndee Hensen, DO 11/28/22 1502
